# Patient Record
Sex: Male | Born: 1980 | Race: White | Hispanic: No | Marital: Married | Smoking: Never smoker
Health system: Southern US, Community
[De-identification: ages and names within clinical notes are randomized; demographics above are authoritative.]

## PROBLEM LIST (undated history)

## (undated) DIAGNOSIS — A6000 Herpesviral infection of urogenital system, unspecified: Secondary | ICD-10-CM

## (undated) DIAGNOSIS — M545 Low back pain, unspecified: Secondary | ICD-10-CM

## (undated) DIAGNOSIS — R519 Headache, unspecified: Secondary | ICD-10-CM

## (undated) DIAGNOSIS — R51 Headache: Secondary | ICD-10-CM

## (undated) HISTORY — DX: Low back pain, unspecified: M54.50

## (undated) HISTORY — DX: Low back pain: M54.5

## (undated) HISTORY — DX: Herpesviral infection of urogenital system, unspecified: A60.00

## (undated) HISTORY — DX: Headache, unspecified: R51.9

## (undated) HISTORY — DX: Headache: R51

---

## 2000-01-07 HISTORY — PX: TENDON REPAIR: SHX5111

## 2011-09-20 ENCOUNTER — Other Ambulatory Visit: Payer: Self-pay | Admitting: Orthopedic Surgery

## 2011-09-20 DIAGNOSIS — R531 Weakness: Secondary | ICD-10-CM

## 2011-09-20 DIAGNOSIS — M545 Low back pain: Secondary | ICD-10-CM

## 2011-09-24 ENCOUNTER — Other Ambulatory Visit: Payer: Self-pay | Admitting: Orthopedic Surgery

## 2011-09-24 DIAGNOSIS — M549 Dorsalgia, unspecified: Secondary | ICD-10-CM

## 2011-09-27 ENCOUNTER — Other Ambulatory Visit: Payer: Self-pay

## 2011-09-27 ENCOUNTER — Inpatient Hospital Stay
Admission: RE | Admit: 2011-09-27 | Discharge: 2011-09-27 | Payer: Self-pay | Source: Ambulatory Visit | Attending: Orthopedic Surgery | Admitting: Orthopedic Surgery

## 2011-09-27 NOTE — Discharge Instructions (Signed)

## 2011-10-04 ENCOUNTER — Other Ambulatory Visit: Payer: Self-pay

## 2011-10-07 ENCOUNTER — Ambulatory Visit
Admission: RE | Admit: 2011-10-07 | Discharge: 2011-10-07 | Disposition: A | Discharge status: Home / Self Care | Payer: 59 | Source: Ambulatory Visit | Attending: Orthopedic Surgery | Admitting: Orthopedic Surgery

## 2011-10-07 VITALS — BP 115/61 | HR 61

## 2011-10-07 DIAGNOSIS — M549 Dorsalgia, unspecified: Secondary | ICD-10-CM

## 2011-10-07 MED ORDER — IOHEXOL 180 MG/ML  SOLN
15.0000 mL | Freq: Once | INTRAMUSCULAR | Status: AC | PRN
  Administered 2011-10-07: 15 mL via EPIDURAL

## 2011-10-07 MED ORDER — DIAZEPAM 5 MG PO TABS
10.0000 mg | ORAL_TABLET | Freq: Once | ORAL | Status: AC
  Administered 2011-10-07: 5 mg via ORAL

## 2011-10-07 MED ORDER — ONDANSETRON HCL 4 MG/2ML IJ SOLN: 4.0000 mg | Freq: Four times a day (QID) | INTRAMUSCULAR | Status: DC | PRN

## 2011-10-07 NOTE — Discharge Instructions (Signed)

## 2011-10-11 ENCOUNTER — Telehealth: Payer: Self-pay

## 2011-10-11 NOTE — Telephone Encounter (Signed)
Patient's wife Ricardo Riggs called from the [place] where they are spending July 4th holiday to state Ricardo Riggs has been experiencing pressure behind his eyes and nose and in neck and pelvis since myelogram here Monday, 10/07/11.  He has "good moments," usually in the mornings, when he has no symptoms, but they progress as the day goes on.  Explained these symptoms sound like spinal fluid leak symptoms, though not so much the common headache symptoms.  Encouraged her to have Ricardo Riggs lie down as much as he can to repeat the 24 hours of strict bedrest and to force fluids.  Explained epidural blood patch procedure and told them I'd try to get order from Dr. August Saucer for one on Monday, 10/14/11, though I hoped he didn't need it.  jkl

## 2011-10-14 ENCOUNTER — Other Ambulatory Visit: Payer: Self-pay | Admitting: Orthopedic Surgery

## 2011-10-14 ENCOUNTER — Ambulatory Visit
Admission: RE | Admit: 2011-10-14 | Discharge: 2011-10-14 | Disposition: A | Discharge status: Home / Self Care | Payer: 59 | Source: Ambulatory Visit | Attending: Orthopedic Surgery | Admitting: Orthopedic Surgery

## 2011-10-14 VITALS — BP 118/69 | HR 68 | Ht 70.0 in | Wt 239.0 lb

## 2011-10-14 DIAGNOSIS — G971 Other reaction to spinal and lumbar puncture: Secondary | ICD-10-CM

## 2011-10-14 MED ORDER — IOHEXOL 180 MG/ML  SOLN
1.0000 mL | Freq: Once | INTRAMUSCULAR | Status: AC | PRN
  Administered 2011-10-14: 1 mL via EPIDURAL

## 2011-10-14 NOTE — Progress Notes (Addendum)
20cc of blood drawn from left antecubital vein, site unremarkable. Pt tolerated procedure well. Blood drawn for blood patch due to positional headache.  1440 pt returned from procedure room and is doing well. Wife to bedside.

## 2011-12-11 ENCOUNTER — Telehealth: Payer: Self-pay

## 2011-12-11 NOTE — Telephone Encounter (Signed)
After speaking with Dr. Deanne Coffer, I called Ricardo Riggs back and told her she needs to follow up with Dr. August Saucer or primary care doctor since so much time has transpired (two months) since we have seen patient and since other physicians have been doing injections on his back.  Dr. Deanne Coffer feels there are too many variables involved and that a physician responsible for driving Ricardo Riggs's care needs to handle his ongoing symptoms.  I suggested Ricardo Riggs first contact Dr. August Saucer and then perhaps their primary care physician, if needed.  jkl

## 2011-12-11 NOTE — Telephone Encounter (Signed)
Patient's wife called this morning to report ongoing spinal headache symptoms since blood patch October 14, 2011 after myelogram here October 07, 2011.  She states the blood patch was "really painful for Jakorey," so bad that she had to provide him something in which to urinate because he couldn't get out of bed.  "Not sure if the blood patch helped."  Patient is becoming "forgetful" and "irritable."   If he sneezes or bends over, symptoms get worse and patient experiences "blackout moments with dots" briefly.  Wife states patient has had "cortisone shots (lumbar epidural steroid injections?) across the street," ordered by Dr. August Saucer, and they "haven't helped his back pain."  I told her I would talk with my radiologist and get back with her shortly.  jkl

## 2011-12-18 ENCOUNTER — Ambulatory Visit
Admission: RE | Admit: 2011-12-18 | Discharge: 2011-12-18 | Disposition: A | Discharge status: Home / Self Care | Payer: Managed Care, Other (non HMO) | Source: Ambulatory Visit | Attending: Orthopedic Surgery | Admitting: Orthopedic Surgery

## 2011-12-18 ENCOUNTER — Other Ambulatory Visit: Payer: Self-pay | Admitting: Orthopedic Surgery

## 2011-12-18 DIAGNOSIS — G971 Other reaction to spinal and lumbar puncture: Secondary | ICD-10-CM

## 2011-12-18 MED ORDER — IOHEXOL 180 MG/ML  SOLN
1.0000 mL | Freq: Once | INTRAMUSCULAR | Status: AC | PRN
  Administered 2011-12-18: 1 mL via EPIDURAL

## 2011-12-18 NOTE — Progress Notes (Signed)
20cc blood drawn from left AC space for procedure without difficulty; site unremarkable.  jkl 

## 2011-12-23 ENCOUNTER — Other Ambulatory Visit: Payer: Self-pay | Admitting: Radiology

## 2011-12-23 ENCOUNTER — Telehealth: Payer: Self-pay

## 2011-12-23 ENCOUNTER — Ambulatory Visit
Admission: RE | Admit: 2011-12-23 | Discharge: 2011-12-23 | Disposition: A | Discharge status: Home / Self Care | Payer: Managed Care, Other (non HMO) | Source: Ambulatory Visit | Attending: Radiology | Admitting: Radiology

## 2011-12-23 ENCOUNTER — Other Ambulatory Visit: Payer: Self-pay | Admitting: Orthopedic Surgery

## 2011-12-23 DIAGNOSIS — R51 Headache: Secondary | ICD-10-CM

## 2011-12-23 NOTE — Progress Notes (Signed)
Patient ID: Ricardo Riggs, male   DOB: 25-Apr-1980, 31 y.o.   MRN: [id number] Established Patient Office Visit - Level II - [phone]  Referring physician: Dr. August Saucer  Subjective: The patient continues have headaches and blurred vision without significant improvement following a blood plaque she 12/18/2011. The symptoms initially began after a myelogram 10/07/2011. There is some relief following a blood patch 10/14/2011. This continued have symptoms since.  The patient describes pressure grounds of building of during the day at the bases head with progressive blurred vision and disorientation. He denies any loss of consciousness. He states the symptoms are exacerbated by changing from sitting to standing position frequently, prolonged sitting, or bending over to his feet. The symptoms are improved when lying flat for the for 5 hours and lobe best with the first to the wakes in the morning. He has been put on some muscle relaxant is within the lumen is low back pain but no significant change in his head symptoms.  At times this symptoms seem to be related to episodes of his back pain. At other times the symptoms are not related to exacerbation of his back pain.  The symptoms are worse with sitting and standing and are partially relieved when lying down. He did seem to have an initial response to the first blood patch in July, but symptoms began returned the following day.  Objective: Blood pressure 116/82. Temperature 98.2. Pulse 66. Respiratory rate 18.  Physical exam is otherwise deferred.  Assessment/Plan: Although the patient's symptoms are temporally related to his myelogram, they are somewhat atypical of the CSF leak headache and had responded only transiently to a blood patch at the level of the lumbar puncture. It seems that any CSF leak occurring at the regional lumbar puncture site has been appropriately treated with blood patches on the right at L3-4.  The differential  diagnosis includes an occult CSF leak elsewhere or headaches related to progression of his back pain. Although the back pain certainly this does seem to exacerbate his headaches, they are not always temporally related.  Would certainly perform another myelogram to look for any occult CSF leak. Prior to proceeding with this, I would suggest that he be seen by a neurologist specializing in headaches. I have therefore referred the patient to Dr. Karenann Cai who has agreed to see Mr. Photographer tomorrow.  If he feels that a CSF headache is still most likely cause, despite atypical symptoms, an additional myelogram could certainly be performed.

## 2011-12-23 NOTE — Telephone Encounter (Signed)
Patient's wife called to say he was doing well after second blood patch on Wednesday, December 18, 2011.  He completed the 24 hours of bedrest and got up Thursday feeling "fabulous; his old self."  Then later that day he got into car and the symptoms "started up all over again."  She reports patient is "fine" when he is standing or lying down but becomes "spacey and has that head pressure again" whenever he sits down.  Asked Ricardo Riggs to call Dr. August Riggs to relay this information to him.  She states she will.  jkl

## 2011-12-24 ENCOUNTER — Other Ambulatory Visit: Payer: Self-pay | Admitting: Neurology

## 2011-12-27 ENCOUNTER — Ambulatory Visit
Admission: RE | Admit: 2011-12-27 | Discharge: 2011-12-27 | Disposition: A | Discharge status: Home / Self Care | Payer: Managed Care, Other (non HMO) | Source: Ambulatory Visit | Attending: Neurology | Admitting: Neurology

## 2011-12-27 VITALS — BP 141/101 | HR 75

## 2011-12-27 VITALS — BP 96/53 | HR 64

## 2011-12-27 LAB — CSF CELL COUNT WITH DIFFERENTIAL: WBC, CSF: 2 cu mm (ref 0–5)

## 2011-12-27 LAB — GLUCOSE, CSF: Glucose, CSF: 59 mg/dL (ref 43–76)

## 2011-12-27 MED ORDER — DIAZEPAM 5 MG PO TABS
10.0000 mg | ORAL_TABLET | Freq: Once | ORAL | Status: AC
  Administered 2011-12-27: 10 mg via ORAL

## 2011-12-27 MED ORDER — HYDROCODONE-ACETAMINOPHEN 5-325 MG PO TABS
1.0000 | ORAL_TABLET | Freq: Once | ORAL | Status: AC
  Administered 2011-12-27: 1 via ORAL

## 2011-12-27 MED ORDER — IOHEXOL 180 MG/ML  SOLN
10.0000 mL | Freq: Once | INTRAMUSCULAR | Status: AC | PRN
  Administered 2011-12-27: 10 mL via INTRATHECAL

## 2011-12-28 LAB — INDIA INK CSF: India Ink CSF: NONE SEEN

## 2011-12-30 ENCOUNTER — Telehealth: Payer: Self-pay | Admitting: Radiology

## 2011-12-30 NOTE — Telephone Encounter (Signed)
Pt's wife describes his symptoms as much the same the same as before the myelo last Friday. His hip and low back c/o were taken care of with application of cold and heat Saturday, and felt much better on Sunday.  Pressure in head is similar to what was described for the past several weeks. Explained that Dr. Clarisse Gouge should be the one to sort this out as Dr. Alfredo Batty had told him he could not see a spinal fluid leak on Friday.

## 2011-12-31 LAB — CSF CULTURE W GRAM STAIN
Gram Stain: NONE SEEN
Gram Stain: NONE SEEN
Organism ID, Bacteria: NO GROWTH

## 2012-01-08 ENCOUNTER — Other Ambulatory Visit: Payer: Self-pay | Admitting: Neurology

## 2012-01-08 DIAGNOSIS — Z139 Encounter for screening, unspecified: Secondary | ICD-10-CM

## 2012-01-08 DIAGNOSIS — R51 Headache: Secondary | ICD-10-CM

## 2012-01-10 ENCOUNTER — Ambulatory Visit
Admission: RE | Admit: 2012-01-10 | Discharge: 2012-01-10 | Disposition: A | Discharge status: Home / Self Care | Payer: Managed Care, Other (non HMO) | Source: Ambulatory Visit | Attending: Neurology | Admitting: Neurology

## 2012-01-10 DIAGNOSIS — Z139 Encounter for screening, unspecified: Secondary | ICD-10-CM

## 2012-01-11 ENCOUNTER — Ambulatory Visit
Admission: RE | Admit: 2012-01-11 | Discharge: 2012-01-11 | Disposition: A | Discharge status: Home / Self Care | Payer: Managed Care, Other (non HMO) | Source: Ambulatory Visit | Attending: Neurology | Admitting: Neurology

## 2012-01-11 DIAGNOSIS — R51 Headache: Secondary | ICD-10-CM

## 2012-01-11 MED ORDER — GADOBENATE DIMEGLUMINE 529 MG/ML IV SOLN
20.0000 mL | Freq: Once | INTRAVENOUS | Status: AC | PRN
  Administered 2012-01-11: 20 mL via INTRAVENOUS

## 2012-01-13 ENCOUNTER — Ambulatory Visit
Admission: RE | Admit: 2012-01-13 | Discharge: 2012-01-13 | Disposition: A | Discharge status: Home / Self Care | Payer: BC Managed Care – PPO | Source: Ambulatory Visit | Attending: Neurology | Admitting: Neurology

## 2012-01-13 ENCOUNTER — Other Ambulatory Visit: Payer: Self-pay | Admitting: Neurology

## 2012-01-13 DIAGNOSIS — G44009 Cluster headache syndrome, unspecified, not intractable: Secondary | ICD-10-CM

## 2012-01-13 DIAGNOSIS — G932 Benign intracranial hypertension: Secondary | ICD-10-CM

## 2012-07-31 ENCOUNTER — Encounter: Payer: Self-pay | Admitting: Family Medicine

## 2012-07-31 ENCOUNTER — Ambulatory Visit (INDEPENDENT_AMBULATORY_CARE_PROVIDER_SITE_OTHER): Payer: Managed Care, Other (non HMO) | Admitting: Family Medicine

## 2012-07-31 ENCOUNTER — Ambulatory Visit: Payer: Self-pay | Admitting: Medical

## 2012-07-31 VITALS — BP 108/76 | HR 65 | Wt 248.0 lb

## 2012-07-31 DIAGNOSIS — R079 Chest pain, unspecified: Secondary | ICD-10-CM

## 2012-07-31 LAB — CBC WITH DIFFERENTIAL/PLATELET
Basophils Absolute: 0 10*3/uL (ref 0.0–0.1)
Basophils Relative: 1 % (ref 0–1)
Eosinophils Absolute: 0.1 10*3/uL (ref 0.0–0.7)
Hemoglobin: 14.5 g/dL (ref 13.0–17.0)
MCH: 29.7 pg (ref 26.0–34.0)
MCHC: 33.8 g/dL (ref 30.0–36.0)
Neutro Abs: 2.3 10*3/uL (ref 1.7–7.7)
Neutrophils Relative %: 48 % (ref 43–77)
Platelets: 275 10*3/uL (ref 150–400)
RDW: 13.6 % (ref 11.5–15.5)

## 2012-07-31 LAB — LIPID PANEL
Cholesterol: 181 mg/dL (ref 0–200)
HDL: 40 mg/dL (ref >39)
LDL Cholesterol: 127 mg/dL — ABNORMAL HIGH (ref 0–99)
Total CHOL/HDL Ratio: 4.5 Ratio
Triglycerides: 72 mg/dL (ref <150)
VLDL: 14 mg/dL (ref 0–40)

## 2012-07-31 LAB — COMPREHENSIVE METABOLIC PANEL
AST: 20 U/L (ref 0–37)
Albumin: 4.7 g/dL (ref 3.5–5.2)
Alkaline Phosphatase: 60 U/L (ref 39–117)
Potassium: 4.6 mEq/L (ref 3.5–5.3)
Sodium: 139 mEq/L (ref 135–145)
Total Bilirubin: 0.9 mg/dL (ref 0.3–1.2)
Total Protein: 7.1 g/dL (ref 6.0–8.3)

## 2012-07-31 MED ORDER — NITROGLYCERIN 0.4 MG SL SUBL: SUBLINGUAL_TABLET | SUBLINGUAL | Status: DC

## 2012-07-31 NOTE — Progress Notes (Signed)
Pt is scheduled with Dr. Viann Fish Cardiology on Monday April 28 @ 10:30am

## 2012-07-31 NOTE — Progress Notes (Signed)
  Subjective:    Patient ID: Ricardo Riggs, male    DOB: 1980-12-25, 32 y.o.   MRN: [id number]  HPI He is here for evaluation of chest pain. He has had 3 episodes of pain in the last 6 weeks, the most recent one was yesterday. He describes the pain as sharp and in the mid chest area associated with weakness and a heavy feeling in his jaws with shortness of breath and clammy feeling. The pain can last up to a couple of hours. The episodes occurred when he was sedentary and one when he was doing his normal activities. He does not smoke. No family history of heart disease.   Review of Systems     Objective:   Physical Exam alert and in no distress. Tympanic membranes and canals are normal. Throat is clear. Tonsils are normal. Neck is supple without adenopathy or thyromegaly. Cardiac exam shows a regular sinus rhythm without murmurs or gallops. Lungs are clear to auscultation. EKG shows no acute changes       Assessment & Plan:  Chest pain - Plan: EKG 12-Lead, CBC with Differential, Comprehensive metabolic panel, Lipid panel, Ambulatory referral to Cardiology, nitroGLYCERIN (NITROSTAT) 0.4 MG SL tablet I discussed the chest pain with him and his wife. Although he is young and does not have a family history, the symptoms are certainly worrisome. Discussed use of nitroglycerin in regard to the pain and when to go to the emergency room prior to being seen by cardiology.

## 2012-08-03 ENCOUNTER — Encounter: Payer: Self-pay | Admitting: Cardiology

## 2012-08-03 DIAGNOSIS — R0789 Other chest pain: Secondary | ICD-10-CM | POA: Insufficient documentation

## 2012-08-03 DIAGNOSIS — E669 Obesity, unspecified: Secondary | ICD-10-CM | POA: Insufficient documentation

## 2012-08-03 DIAGNOSIS — M519 Unspecified thoracic, thoracolumbar and lumbosacral intervertebral disc disorder: Secondary | ICD-10-CM | POA: Insufficient documentation

## 2012-08-03 NOTE — Progress Notes (Signed)
Patient ID: Ricardo Riggs, male   DOB: 06-Jan-1981, 32 y.o.   MRN: [id number] Ricardo Riggs, [place]    Date of visit:  08/03/2012 DOB:  [birth year]    Age:  32 yrs. Medical record number:  [id number]     Account number:  [id number] Primary Care Provider: Sharlot Gowda C ____________________________ CURRENT DIAGNOSES  1. Obesity(BMI30-40)  2. Chest Pain ____________________________ ALLERGIES  NKDA ____________________________ MEDICATIONS  1. methocarbamol 500 mg tablet, PRN  2. meloxicam 15 mg tablet, 1 p.o. daily ____________________________ CHIEF COMPLAINTS  Began 6 weks ago  Chest pain  l arm + jaw pain ____________________________ HISTORY OF PRESENT ILLNESS  Patient seen at the request of Dr. Susann Givens for evaluation of chest pain. The patient and his family recently moved from [place] to [place] with a job change. The patient has been in good health except for obesity for many years. He is normally able to do yard work and exercise without chest discomfort. He comes in today with 3 episodes of chest pain occurring over the past 2 months. 2 of them have occurred while he was at work getting ready to work on some machinery and were described as a midsternal tightness that radiated into the jaw and somewhat into the inner aspect of his left arm. The discomfort would last for 20 minutes and then he would have jaw discomfort the rest of the day but no chest discomfort. He had another episode that occurred after eating and was similar to this but neither was associated with extreme exertion. He otherwise has no cardiac risk factors. He denies angina and has no PND, orthopnea or claudication. He has no significant edema. He does have chronic low back pain and had a treadmill several years ago because of some atypical chest pain. ____________________________ PAST HISTORY  Past Medical Illnesses:  denies hypertension or diabetes;  Cardiovascular Illnesses:  no previous history of cardiac disease.;  Surgical  Procedures:  right wrist surgery for trauma;  Cardiology Procedures-Invasive:  no history of prior cardiac procedures;  Cardiology Procedures-Noninvasive:  treadmill;  LVEF not documented,   ____________________________ CARDIO-PULMONARY TEST DATES EKG Date:  08/03/2012;   ____________________________ FAMILY HISTORY Father - age 71,  alive and well; Mother - age 66,  alive and well;  ____________________________ SOCIAL HISTORY Alcohol Use:  occasionally;  Smoking:  never smoked;  Diet:  regular diet;  Lifestyle:  married;  Exercise:  no regular exercise;  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 Eyes: denies diplopia, history of glaucoma or visual problems. Ears, Nose, Throat, Mouth:  denies any hearing loss, epistaxis, hoarseness or difficulty speaking. Respiratory: occasional wheezing Cardiovascular:  please review HPI Abdominal: denies dyspepsia, GI bleeding, constipation, or diarrhea Genitourinary-Male: no dysuria, urgency, frequency, or nocturia  Musculoskeletal:  chronic low back pain Neurological:  denies headaches, stroke, or TIA  ____________________________ PHYSICAL EXAMINATION VITAL SIGNS  Blood Pressure:  88/60 Sitting, Left arm, regular cuff  , 94/60 Standing, Left arm and regular cuff   Pulse:  78/min. Weight:  248.00 lbs. Height:  70"BMI: 35  Constitutional:  pleasant white male in no acute distress, moderately obese Skin:  warm and dry to touch, no apparent skin lesions, or masses noted. Head:  normocephalic, normal hair pattern, no masses or tenderness Eyes:  EOMS Intact, PERRLA, C and S clear, Funduscopic exam not done. ENT:  ears, nose and throat reveal no  gross abnormalities.  Dentition good. Neck:  supple, without massess. No JVD, thyromegaly or carotid bruits. Carotid upstroke normal. Chest:  normal symmetry,  clear to auscultation and percussion. Cardiac:  regular rhythm, normal S1 and S2, No S3 or S4, no murmurs, gallops or rubs detected. Abdomen:  abdomen soft,non-tender, no masses, no hepatospenomegaly, or aneurysm noted Peripheral Pulses:  the femoral,dorsalis pedis, and posterior tibial pulses are full and equal bilaterally with no bruits auscultated. Extremities & Back:  no deformities, clubbing, cyanosis, erythema or edema observed. Normal muscle strength and tone. Neurological:  no gross motor or sensory deficits noted, affect appropriate, oriented x3. ____________________________ IMPRESSIONS/PLAN  1. Chest pain with atypical features with a normal EKG and minimal risk factors 2. Obesity 3. Chronic low back pain  Recommendations:  The chest pain sounded atypical for angina to me. My recommendations would be for him to have an exercise treadmill test. Also talked about reducing weight and reducing his caloric intake.  ____________________________ TODAYS ORDERS  1. 12 Lead EKG: Today  2. treadmill:  Regular TM At [facility]                       ____________________________ Cardiology Physician:  Darden Palmer MD [facility]

## 2012-08-03 NOTE — Progress Notes (Signed)
Quick Note:  FAXED LABS TO DR.TILLEY ______

## 2012-08-14 ENCOUNTER — Encounter: Payer: Self-pay | Admitting: Internal Medicine

## 2012-09-02 ENCOUNTER — Ambulatory Visit (INDEPENDENT_AMBULATORY_CARE_PROVIDER_SITE_OTHER): Payer: Managed Care, Other (non HMO) | Admitting: Family Medicine

## 2012-09-02 ENCOUNTER — Encounter: Payer: Self-pay | Admitting: Family Medicine

## 2012-09-02 VITALS — BP 110/70 | HR 68 | Ht 71.0 in | Wt 248.0 lb

## 2012-09-02 DIAGNOSIS — A6000 Herpesviral infection of urogenital system, unspecified: Secondary | ICD-10-CM

## 2012-09-02 DIAGNOSIS — Z0000 Encounter for general adult medical examination without abnormal findings: Secondary | ICD-10-CM

## 2012-09-02 LAB — POCT URINALYSIS DIPSTICK
Bilirubin, UA: NEGATIVE
Blood, UA: NEGATIVE
Glucose, UA: NEGATIVE
Leukocytes, UA: NEGATIVE
Nitrite, UA: NEGATIVE
Urobilinogen, UA: NEGATIVE
pH, UA: 5

## 2012-09-02 MED ORDER — VALACYCLOVIR HCL 500 MG PO TABS: 500.0000 mg | ORAL_TABLET | Freq: Two times a day (BID) | ORAL | Status: DC

## 2012-09-02 NOTE — Patient Instructions (Addendum)
Please check into the date and type of your last tetanus shot.  It is recommended to have one booster of TdaP (contains pertussis--whooping cough vaccine in addition to tetanus).  If you have not had the pertussis (just TD), then booster is recommended.  If you had TdaP, you will need another Td (no pertussis) 10 years from the last injection.  Please try and get [place] the date and type of vaccine you think you had about 4 years ago  HEALTH MAINTENANCE RECOMMENDATIONS:  Please schedule appt with dentist   It is recommended that you get at least 30 minutes of aerobic exercise at least 5 days/week (for weight loss, you may need as much as 60-90 minutes). This can be any activity that gets your heart rate up. This can be divided in 10-15 minute intervals if needed, but try and build up your endurance at least once a week.  Weight bearing exercise is also recommended twice weekly.  Eat a healthy diet with lots of vegetables, fruits and fiber.  "Colorful" foods have a lot of vitamins (ie green vegetables, tomatoes, red peppers, etc).  Limit sweet tea, regular sodas and alcoholic beverages, all of which has a lot of calories and sugar.  Up to 2 alcoholic drinks daily may be beneficial for men (unless trying to lose weight, watch sugars).  Drink a lot of water.  Sunscreen of at least SPF 30 should be used on all sun-exposed parts of the skin when outside between the hours of 10 am and 4 pm (not just when at beach or pool, but even with exercise, golf, tennis, and yard work!)  Use a sunscreen that says "broad spectrum" so it covers both UVA and UVB rays, and make sure to reapply every 1-2 hours.  Remember to change the batteries in your smoke detectors when changing your clock times in the spring and fall.  Use your seat belt every time you are in a car, and please drive safely and not be distracted with cell phones and texting while driving.  Check your testicles once a month for any lumps/masses  Lab  Results  Component Value Date   CHOL 181 07/31/2012   HDL 40 07/31/2012   LDLCALC 127* 07/31/2012   TRIG 72 07/31/2012   CHOLHDL 4.5 07/31/2012    Your chol/HDL ratio should be <4.  Ideally, your HDL should be higher.  Daily exercise, fish oil and low cholesterol diet is recommended. Recheck next year  Fat and Cholesterol Control Diet Cholesterol levels in your body are determined significantly by your diet. Cholesterol levels may also be related to heart disease. The following material helps to explain this relationship and discusses what you can do to help keep your heart healthy. Not all cholesterol is bad. Low-density lipoprotein (LDL) cholesterol is the "bad" cholesterol. It may cause fatty deposits to build up inside your arteries. High-density lipoprotein (HDL) cholesterol is "good." It helps to remove the "bad" LDL cholesterol from your blood. Cholesterol is a very important risk factor for heart disease. Other risk factors are high blood pressure, smoking, stress, heredity, and weight. The heart muscle gets its supply of blood through the coronary arteries. If your LDL cholesterol is high and your HDL cholesterol is low, you are at risk for having fatty deposits build up in your coronary arteries. This leaves less room through which blood can flow. Without sufficient blood and oxygen, the heart muscle cannot function properly and you may feel chest pains (angina pectoris). When a  coronary artery closes up entirely, a part of the heart muscle may die causing a heart attack (myocardial infarction). CHECKING CHOLESTEROL When your caregiver sends your blood to a lab to be examined for cholesterol, a complete lipid (fat) profile may be done. With this test, the total amount of cholesterol and levels of LDL and HDL are determined. Triglycerides are a type of fat that circulates in the blood. They can also be used to determine heart disease risk. The list below describes what the numbers should  be: Test: Total Cholesterol.  Less than 200 mg/dl. Test: LDL "bad cholesterol."  Less than 100 mg/dl.  Less than 70 mg/dl if you are at very high risk of a heart attack or sudden cardiac death. Test: HDL "good cholesterol."  Greater than 50 mg/dl for women.  Greater than 40 mg/dl for men. Test: Triglycerides.  Less than 150 mg/dl. CONTROLLING CHOLESTEROL WITH DIET Although exercise and lifestyle factors are important, your diet is key. That is because certain foods are known to raise cholesterol and others to lower it. The goal is to balance foods for their effect on cholesterol and more importantly, to replace saturated and trans fat with other types of fat, such as monounsaturated fat, polyunsaturated fat, and omega-3 fatty acids. On average, a person should consume no more than 15 to 17 g of saturated fat daily. Saturated and trans fats are considered "bad" fats, and they will raise LDL cholesterol. Saturated fats are primarily found in animal products such as meats, butter, and cream. However, that does not mean you need to give up all your favorite foods. Today, there are good tasting, low-fat, low-cholesterol substitutes for most of the things you like to eat. Choose low-fat or nonfat alternatives. Choose round or loin cuts of red meat. These types of cuts are lowest in fat and cholesterol. Chicken (without the skin), fish, veal, and ground [place] breast are great choices. Eliminate fatty meats, such as hot dogs and salami. Even shellfish have little or no saturated fat. Have a 3 oz (85 g) portion when you eat lean meat, poultry, or fish. Trans fats are also called "partially hydrogenated oils." They are oils that have been scientifically manipulated so that they are solid at room temperature resulting in a longer shelf life and improved taste and texture of foods in which they are added. Trans fats are found in stick margarine, some tub margarines, cookies, crackers, and baked goods.   When baking and cooking, oils are a great substitute for butter. The monounsaturated oils are especially beneficial since it is believed they lower LDL and raise HDL. The oils you should avoid entirely are saturated tropical oils, such as coconut and palm.  Remember to eat a lot from food groups that are naturally free of saturated and trans fat, including fish, fruit, vegetables, beans, grains (barley, rice, couscous, bulgur wheat), and pasta (without cream sauces).  IDENTIFYING FOODS THAT LOWER CHOLESTEROL  Soluble fiber may lower your cholesterol. This type of fiber is found in fruits such as apples, vegetables such as broccoli, potatoes, and carrots, legumes such as beans, peas, and lentils, and grains such as barley. Foods fortified with plant sterols (phytosterol) may also lower cholesterol. You should eat at least 2 g per day of these foods for a cholesterol lowering effect.  Read package labels to identify low-saturated fats, trans fat free, and low-fat foods at the supermarket. Select cheeses that have only 2 to 3 g saturated fat per ounce. Use a heart-healthy tub  margarine that is free of trans fats or partially hydrogenated oil. When buying baked goods (cookies, crackers), avoid partially hydrogenated oils. Breads and muffins should be made from whole grains (whole-wheat or whole oat flour, instead of "flour" or "enriched flour"). Buy non-creamy canned soups with reduced salt and no added fats.  FOOD PREPARATION TECHNIQUES  Never deep-fry. If you must fry, either stir-fry, which uses very little fat, or use non-stick cooking sprays. When possible, broil, bake, or roast meats, and steam vegetables. Instead of putting butter or margarine on vegetables, use lemon and herbs, applesauce, and cinnamon (for squash and sweet potatoes), nonfat yogurt, salsa, and low-fat dressings for salads.  LOW-SATURATED FAT / LOW-FAT FOOD SUBSTITUTES Meats / Saturated Fat (g)  Avoid: Steak, marbled (3 oz/85 g) / 11  g  Choose: Steak, lean (3 oz/85 g) / 4 g  Avoid: Hamburger (3 oz/85 g) / 7 g  Choose: Hamburger, lean (3 oz/85 g) / 5 g  Avoid: Ham (3 oz/85 g) / 6 g  Choose: Ham, lean cut (3 oz/85 g) / 2.4 g  Avoid: Chicken, with skin, dark meat (3 oz/85 g) / 4 g  Choose: Chicken, skin removed, dark meat (3 oz/85 g) / 2 g  Avoid: Chicken, with skin, light meat (3 oz/85 g) / 2.5 g  Choose: Chicken, skin removed, light meat (3 oz/85 g) / 1 g Dairy / Saturated Fat (g)  Avoid: Whole milk (1 cup) / 5 g  Choose: Low-fat milk, 2% (1 cup) / 3 g  Choose: Low-fat milk, 1% (1 cup) / 1.5 g  Choose: Skim milk (1 cup) / 0.3 g  Avoid: Hard cheese (1 oz/28 g) / 6 g  Choose: Skim milk cheese (1 oz/28 g) / 2 to 3 g  Avoid: Cottage cheese, 4% fat (1 cup) / 6.5 g  Choose: Low-fat cottage cheese, 1% fat (1 cup) / 1.5 g  Avoid: Ice cream (1 cup) / 9 g  Choose: Sherbet (1 cup) / 2.5 g  Choose: Nonfat frozen yogurt (1 cup) / 0.3 g  Choose: Frozen fruit bar / trace  Avoid: Whipped cream (1 tbs) / 3.5 g  Choose: Nondairy whipped topping (1 tbs) / 1 g Condiments / Saturated Fat (g)  Avoid: Mayonnaise (1 tbs) / 2 g  Choose: Low-fat mayonnaise (1 tbs) / 1 g  Avoid: Butter (1 tbs) / 7 g  Choose: Extra light margarine (1 tbs) / 1 g  Avoid: Coconut oil (1 tbs) / 11.8 g  Choose: Olive oil (1 tbs) / 1.8 g  Choose: Corn oil (1 tbs) / 1.7 g  Choose: Safflower oil (1 tbs) / 1.2 g  Choose: Sunflower oil (1 tbs) / 1.4 g  Choose: Soybean oil (1 tbs) / 2.4 g  Choose: Canola oil (1 tbs) / 1 g Document Released: 03/25/2005 Document Revised: 06/17/2011 Document Reviewed: 09/13/2010 [employer] Patient Information 2014 [place], [place].
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
## 2013-01-01 ENCOUNTER — Encounter: Payer: Self-pay | Admitting: Family Medicine

## 2013-01-01 ENCOUNTER — Ambulatory Visit: Payer: Managed Care, Other (non HMO) | Admitting: Family Medicine

## 2013-01-01 ENCOUNTER — Ambulatory Visit (INDEPENDENT_AMBULATORY_CARE_PROVIDER_SITE_OTHER): Payer: Managed Care, Other (non HMO) | Admitting: Family Medicine

## 2013-01-01 VITALS — BP 108/70 | HR 60 | Ht 70.0 in | Wt 240.0 lb

## 2013-01-01 DIAGNOSIS — N50812 Left testicular pain: Secondary | ICD-10-CM

## 2013-01-01 DIAGNOSIS — N509 Disorder of male genital organs, unspecified: Secondary | ICD-10-CM

## 2013-01-01 DIAGNOSIS — R1032 Left lower quadrant pain: Secondary | ICD-10-CM

## 2013-01-01 LAB — POCT URINALYSIS DIPSTICK
Glucose, UA: NEGATIVE
Nitrite, UA: NEGATIVE
Protein, UA: NEGATIVE
Spec Grav, UA: 1.02
Urobilinogen, UA: NEGATIVE
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
## 2013-01-01 NOTE — Patient Instructions (Addendum)
I suspect that your left sided abdominal pain, which also went into the scrotum, is likely due to a kidney stone.  There was a small amount of blood in the urine, and you did get dehydrated just prior to onset of symptoms.  Drink plenty of fluid (non-alcoholic, non-caffeinated fluids).  Expect that pain should continue to improve.  If you have severe/worsening pain, especially with any fever, you need to be re-evaluated.  Fever could suggest infection; severe pain could be that the stone isn't passing, and causing obstruction (and imaging might be needed to confirm).  Avoid getting dehydrated in future  Ureteral Colic (Kidney Stones) Ureteral colic is the result of a condition when kidney stones form inside the kidney. Once kidney stones are formed they may move into the tube that connects the kidney with the bladder (ureter). If this occurs, this condition may cause pain (colic) in the ureter.  CAUSES  Pain is caused by stone movement in the ureter and the obstruction caused by the stone. SYMPTOMS  The pain comes and goes as the ureter contracts around the stone. The pain is usually intense, sharp, and stabbing in character. The location of the pain may move as the stone moves through the ureter. When the stone is near the kidney the pain is usually located in the back and radiates to the belly (abdomen). When the stone is ready to pass into the bladder the pain is often located in the lower abdomen on the side the stone is located. At this location, the symptoms may mimic those of a urinary tract infection with urinary frequency. Once the stone is located here it often passes into the bladder and the pain disappears completely. TREATMENT   Your caregiver will provide you with medicine for pain relief.  You may require specialized follow-up X-rays.  The absence of pain does not always mean that the stone has passed. It may have just stopped moving. If the urine remains completely obstructed, it can  cause loss of kidney function or even complete destruction of the involved kidney. It is your responsibility and in your interest that X-rays and follow-ups as suggested by your caregiver are completed. Relief of pain without passage of the stone can be associated with severe damage to the kidney, including loss of kidney function on that side.  If your stone does not pass on its own, additional measures may be taken by your caregiver to ensure its removal. HOME CARE INSTRUCTIONS   Increase your fluid intake. Water is the preferred fluid since juices containing vitamin C may acidify the urine making it less likely for certain stones (uric acid stones) to pass.  Strain all urine. A strainer will be provided. Keep all particulate matter or stones for your caregiver to inspect.  Take your pain medicine as directed.  Make a follow-up appointment with your caregiver as directed.  Remember that the goal is passage of your stone. The absence of pain does not mean the stone is gone. Follow your caregiver's instructions.  Only take over-the-counter or prescription medicines for pain, discomfort, or fever as directed by your caregiver. SEEK MEDICAL CARE IF:   Pain cannot be controlled with the prescribed medicine.  You have a fever.  Pain continues for longer than your caregiver advises it should.  There is a change in the pain, and you develop chest discomfort or constant abdominal pain.  You feel faint or pass out. MAKE SURE YOU:   Understand these instructions.  Will watch your  condition.  Will get help right away if you are not doing well or get worse. Document Released: 01/02/2005 Document Revised: 06/17/2011 Document Reviewed: 09/19/2010 [facility] Patient Information 2014 [place], [place].

## 2013-02-04 ENCOUNTER — Ambulatory Visit (INDEPENDENT_AMBULATORY_CARE_PROVIDER_SITE_OTHER): Payer: Managed Care, Other (non HMO) | Admitting: Family Medicine

## 2013-02-04 ENCOUNTER — Encounter: Payer: Self-pay | Admitting: Family Medicine

## 2013-02-04 VITALS — BP 100/62 | HR 72 | Temp 98.1°F | Ht 70.0 in | Wt 238.0 lb

## 2013-02-04 DIAGNOSIS — J069 Acute upper respiratory infection, unspecified: Secondary | ICD-10-CM

## 2013-02-04 DIAGNOSIS — J029 Acute pharyngitis, unspecified: Secondary | ICD-10-CM
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
## 2013-02-04 NOTE — Patient Instructions (Signed)
Drink plenty of fluid Continue Mucinex-D (either twice daily, or just in the morning and Nyquil at night) Try sinus rinses or netipot. Continue salt water gargles, use tylenol or ibuprofen as needed for pain or fever.  Call in a week if symptoms are persistent, or worsening Return if having recurrent fevers, shortness of breath, chest pain, or other worsening symptoms.

## 2013-05-07 ENCOUNTER — Encounter: Payer: Self-pay | Admitting: Family Medicine

## 2013-05-07 ENCOUNTER — Ambulatory Visit (INDEPENDENT_AMBULATORY_CARE_PROVIDER_SITE_OTHER): Payer: Managed Care, Other (non HMO) | Admitting: Family Medicine

## 2013-05-07 VITALS — BP 106/70 | HR 68 | Wt 245.0 lb

## 2013-05-07 DIAGNOSIS — J209 Acute bronchitis, unspecified: Secondary | ICD-10-CM

## 2013-05-07 MED ORDER — AMOXICILLIN-POT CLAVULANATE 875-125 MG PO TABS: 1.0000 | ORAL_TABLET | Freq: Two times a day (BID) | ORAL | Status: AC

## 2013-05-07 NOTE — Progress Notes (Signed)
   Subjective:    Patient ID: Ricardo Riggs, male    DOB: 1980/07/21, 33 y.o.   MRN: [id number]  HPI Has a ten-day history of sore throat and nasal congestion, coughing, fatigue and malaise ;no fever, chills or earache. The coughing has gotten worse and is now productive. He has no allergies and does not smoke. 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 Does have a previous history of difficulty with Amoxil not usually clearing him. Review of Systems     Objective:   Physical Exam alert and in no distress. Tympanic membranes and canals are normal. Throat is clear. Tonsils are normal. Neck is supple without adenopathy or thyromegaly. Cardiac exam shows a regular sinus rhythm without murmurs or gallops. Lungs are clear to auscultation.        Assessment & Plan:  Acute bronchitis - Plan: amoxicillin-clavulanate (AUGMENTIN) 875-125 MG per tablet

## 2013-05-07 NOTE — Patient Instructions (Addendum)
Take all the antibiotic and if not totally back to normal when you finish call me. use any cough suppressant you wantBronchitis Bronchitis is inflammation of the airways that extend from the windpipe into the lungs (bronchi). The inflammation often causes mucus to develop, which leads to a cough. If the inflammation becomes severe, it may cause shortness of breath. CAUSES  Bronchitis may be caused by:   Viral infections.   Bacteria.   Cigarette smoke.   Allergens, pollutants, and other irritants.  SIGNS AND SYMPTOMS  The most common symptom of bronchitis is a frequent cough that produces mucus. Other symptoms include:  Fever.   Body aches.   Chest congestion.   Chills.   Shortness of breath.   Sore throat.  DIAGNOSIS  Bronchitis is usually diagnosed through a medical history and physical exam. Tests, such as chest X-rays, are sometimes done to rule out other conditions.  TREATMENT  You may need to avoid contact with whatever caused the problem (smoking, for example). Medicines are sometimes needed. These may include:  Antibiotics. These may be prescribed if the condition is caused by bacteria.  Cough suppressants. These may be prescribed for relief of cough symptoms.   Inhaled medicines. These may be prescribed to help open your airways and make it easier for you to breathe.   Steroid medicines. These may be prescribed for those with recurrent (chronic) bronchitis. HOME CARE INSTRUCTIONS  Get plenty of rest.   Drink enough fluids to keep your urine clear or pale yellow (unless you have a medical condition that requires fluid restriction). Increasing fluids may help thin your secretions and will prevent dehydration.   Only take over-the-counter or prescription medicines as directed by your health care provider.  Only take antibiotics as directed. Make sure you finish them even if you start to feel better.  Avoid secondhand smoke, irritating chemicals, and  strong fumes. These will make bronchitis worse. If you are a smoker, quit smoking. Consider using nicotine gum or skin patches to help control withdrawal symptoms. Quitting smoking will help your lungs heal faster.   Put a cool-mist humidifier in your bedroom at night to moisten the air. This may help loosen mucus. Change the water in the humidifier daily. You can also run the hot water in your shower and sit in the bathroom with the door closed for 5 10 minutes.   Follow up with your health care provider as directed.   Wash your hands frequently to avoid catching bronchitis again or spreading an infection to others.  SEEK MEDICAL CARE IF: Your symptoms do not improve after 1 week of treatment.  SEEK IMMEDIATE MEDICAL CARE IF:  Your fever increases.  You have chills.   You have chest pain.   You have worsening shortness of breath.   You have bloody sputum.  You faint.  You have lightheadedness.  You have a severe headache.   You vomit repeatedly. MAKE SURE YOU:   Understand these instructions.  Will watch your condition.  Will get help right away if you are not doing well or get worse. Document Released: 03/25/2005 Document Revised: 01/13/2013 Document Reviewed: 11/17/2012 [facility] Patient Information 2014 [place], [place].

## 2014-03-30 IMAGING — CT CT L SPINE W/ CM
3 of 13 series · 10 of 33 positions shown, 11 images · IV contrast (omnipaque)
Comparison: Lumbar myelogram 10/07/2011.

CLINICAL DATA: Positional headaches following lumbar myelography.
The patient has had minimal relief with two epidural blood patches.

MYELOGRAM INJECTION
TECHNIQUE: Informed consent was obtained from the patient prior to
the procedure, including potential complications of headache,
allergy, infection and pain.  A timeout procedure was performed.
With the patient prone, the lower back was prepped with Betadine.
1% Lidocaine was used for local anesthesia.  Lumbar puncture was
performed at the left paramidline L2-3 level using a 20 gauge
needle with return of clear CSF.  10 ml of Omnipaque 100was
injected into the subarachnoid space .
TECHNIQUE: I personally performed the lumbar puncture and
administered the intrathecal contrast. I also personally supervised
acquisition of the myelogram images. Following injection of
intrathecal Omnipaque contrast, spine imaging in multiple
projections was performed using fluoroscopy.
Fluoroscopy Time: 2.49 minutes.
TECHNIQUE: CT imaging of the cervical spine was performed after
intrathecal contrast administration. Multiplanar CT image
reconstructions were also generated.
TECHNIQUE: CT imaging of the thoracic spine was performed after
intrathecal contrast administration.  Multiplanar CT image
TECHNIQUE: CT imaging of the lumbar spine was performed after

[Series 2: l spine bone · axial · 0.31mm/px · z∈[-292,-74]mm · 2 of 262 slices shown, 3 images]
[im 88/262  soft-tissue]
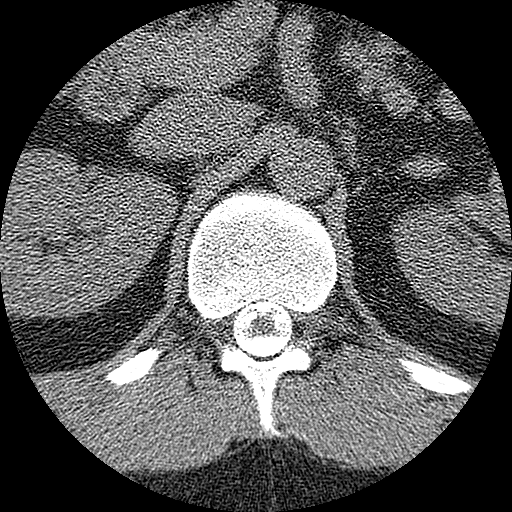
[im 88/262  bone]
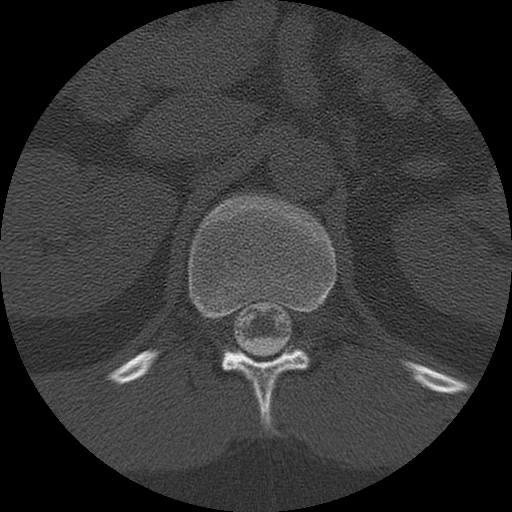
[im 175/262  bone]
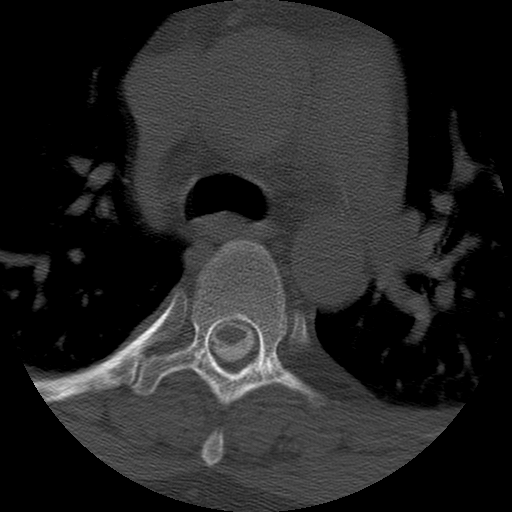

[Series 3: l. spine soft · axial · 0.31mm/px · z∈[-346,-22]mm · 3 of 261 slices shown]
[im 66/261  soft-tissue]
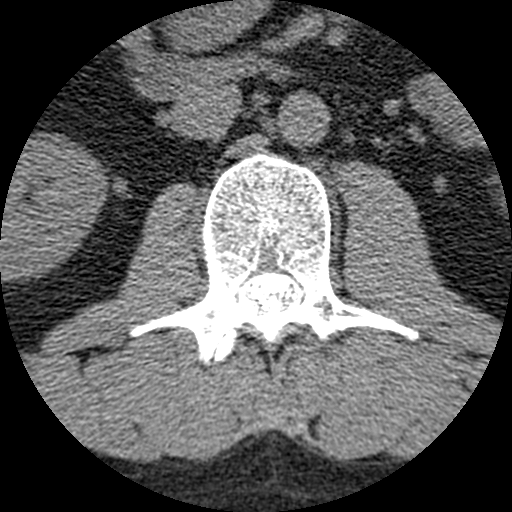
[im 131/261  soft-tissue]
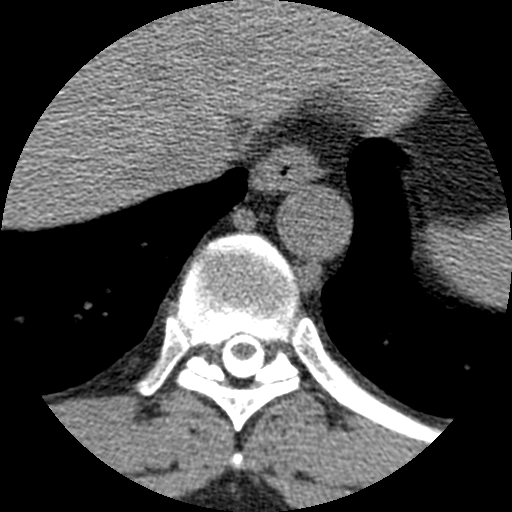
[im 196/261  soft-tissue]
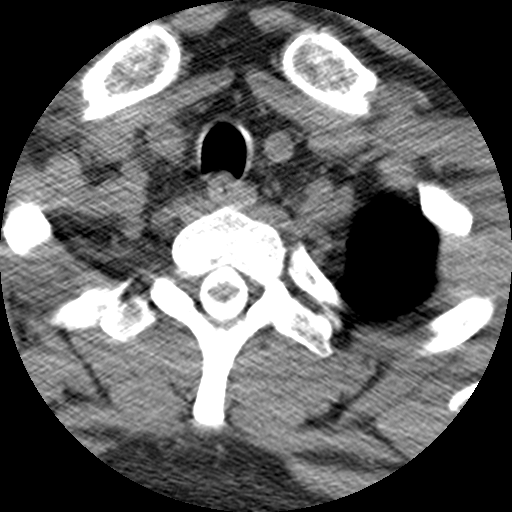

[Series 403: tsp sag · sagittal · 0.64mm/px · 5 of 50 slices shown]
[im 9/50  bone]
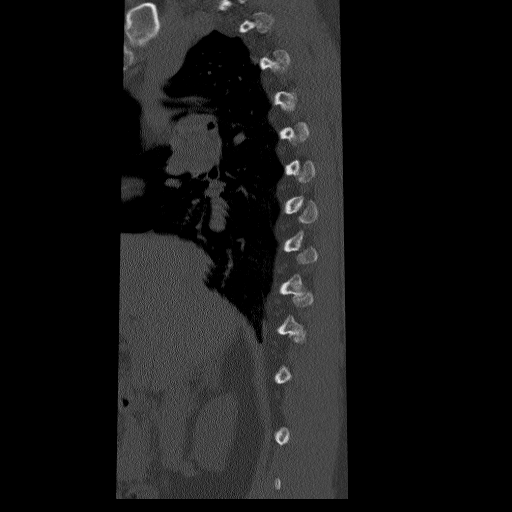
[im 17/50  bone]
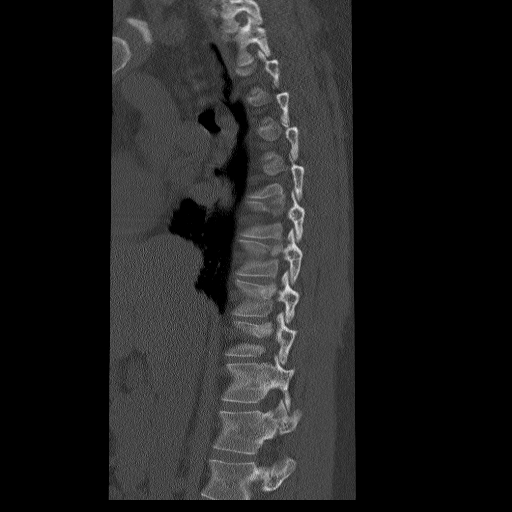
[im 25/50  bone]
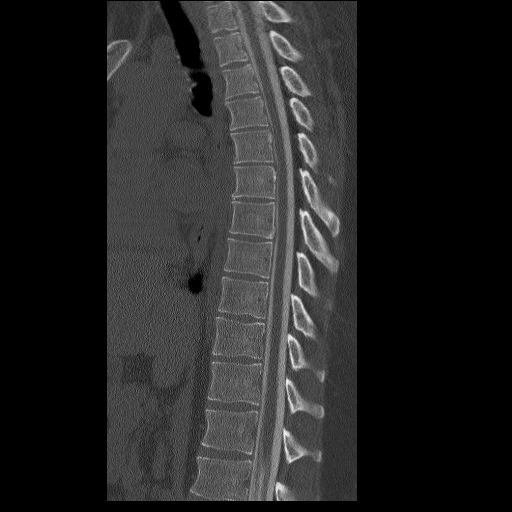
[im 33/50  bone]
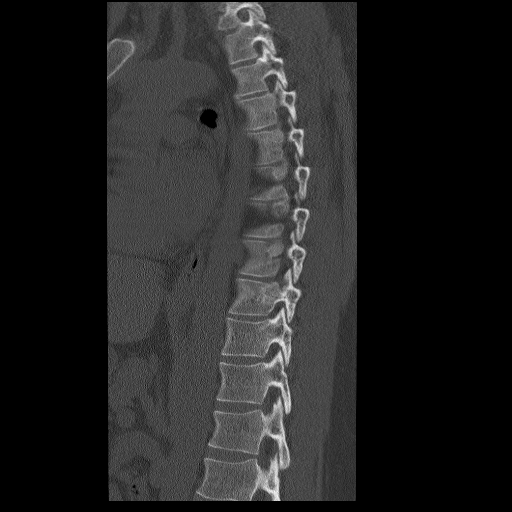
[im 41/50  bone]
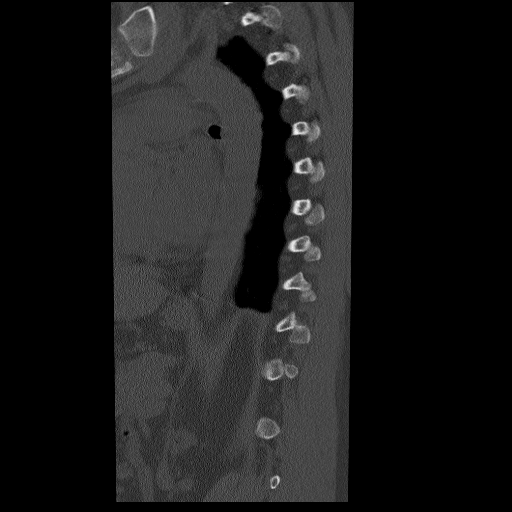

[10 of 33 positions shown; findings below may reference images not displayed]

IMPRESSION: Successful injection of  intrathecal contrast for myelography.

MYELOGRAM CERVICAL AND THORACIC AND LUMBAR
FINDINGS: Prominence of the epidural space at L5-S1 is again noted.
The lumbar nerve roots fill normally on both sides.

No significant CSF leak is evident within the lumbar spine.

Imaging of the thoracic spine demonstrates no significant disc
disease, focal stenosis, or contrast leak.

Imaging of the cervical spine demonstrates normal filling of the
nerve roots bilaterally.  A mild disc osteophyte complex is present
at C4-5 without significant stenosis elsewhere.  No significant
contrast leak is evident.
IMPRESSION: 1.  No significant contrast leak identified in the cervical,
thoracic, or lumbar spine.
2.  Stable prominence of the ventral epidural space at L5-S1.
3.  Minimal disc osteophyte complex at C4-5 without significant
stenosis.
4.  No significant thoracic disc disease or stenosis.


CT MYELOGRAPHY CERVICAL SPINE
FINDINGS: Mild rightward curvature is present in the cervical
spine.  This may be positional.  Contrast is contained within the
thecal sac.  A disc osteophyte complex partially effaces the
ventral CSF at C4-5.  In mild disc osteophyte complex is present at
C5-6 as well.  There is slight anterolisthesis at C4-5.  There is
no significant disc disease at C6-7.  The foramina are present
bilaterally.
IMPRESSION: 1.  No evidence for CSF leak.
2.  Mild disc osteophyte complex at C4-5 and C5-6 with effacement
of the ventral CSF at both levels but no significant central canal
stenosis.
3.  Mild anterolisthesis at C4-5.


CT MYELOGRAPHY THORACIC SPINE
FINDINGS: Contrast is contained within the thecal sac.  There is
no focal extravasation or CSF leak.

A minimal left paracentral disc protrusion is present at C3-4.  No
other significant disc herniations are present.  There is no
significant stenosis.  The foramina are patent bilaterally.  The
conus medullaris terminates at L1, within normal limits.  The
vertebral body heights and alignment are maintained.  The
paraspinous soft tissues and visualized lung bases are clear.
IMPRESSION: 1.  No evidence for CSF leak.
2.  No significant disc herniation or focal stenosis.



CT MYELOGRAPHY LUMBAR SPINE
FINDINGS: Focal extradural contrast is present via a left
paramidline approach at L2-3, corresponding to the needle puncture
site for today's examination.  There is no extradural contrast on
the right at L3-4.  No other significant extradural contrast is
present.

There is no significant disc disease at the L4-5 level and higher.

Chronic loss of disc height and a calcified disc are present at L5-
S1.  A vacuum disc is present.  There is greater loss of disc
height on the right.  Moderate right foraminal stenosis is present.
A central disc protrusion is evident without significant central
canal narrowing.
IMPRESSION: 1.  No focal CSF leak associated with the previous lumbar puncture
at to L3-4 on the right.
2.  There is a focal linear area of contrast the left paramidline
approach at L2-3, corresponding to 2 days puncture.
3.  Stable disc disease at L5-S1 with moderate right foraminal
stenosis.

## 2014-04-16 IMAGING — US US EXTREM  UP VENOUS*R*
1 series · 10 of 10 positions shown · non-contrast
Comparison: MR 01/11/2012.

CLINICAL DATA: Headaches, increased intracranial pressure.
Possible right jugular vein thrombosis on MR venogram.

RIGHT UPPER EXTREMITY VENOUS ULTRASOUND
TECHNIQUE: Limited duplex scan of the right upper extremity was
performed.

[Series 1: us extrem up venous*right* · 10 of 10 slices shown]
[im 1/10]
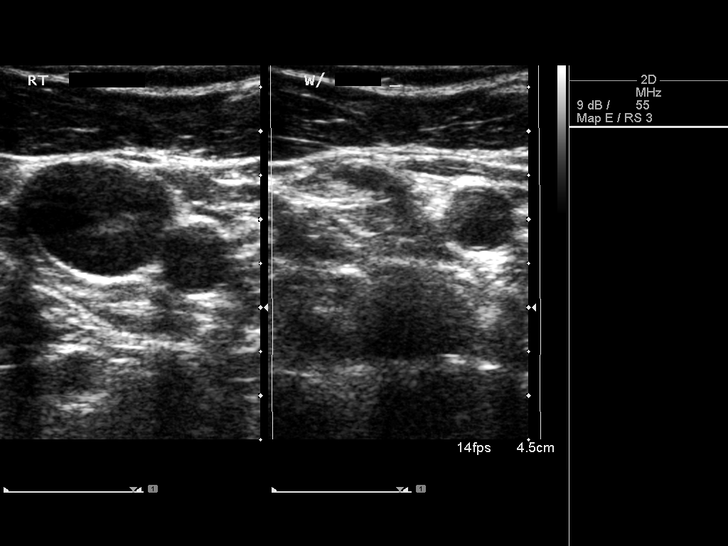
[im 2/10]
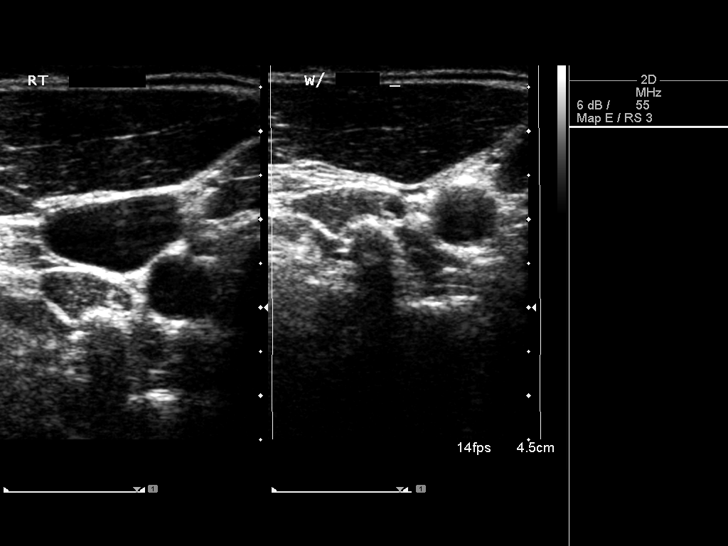
[im 3/10]
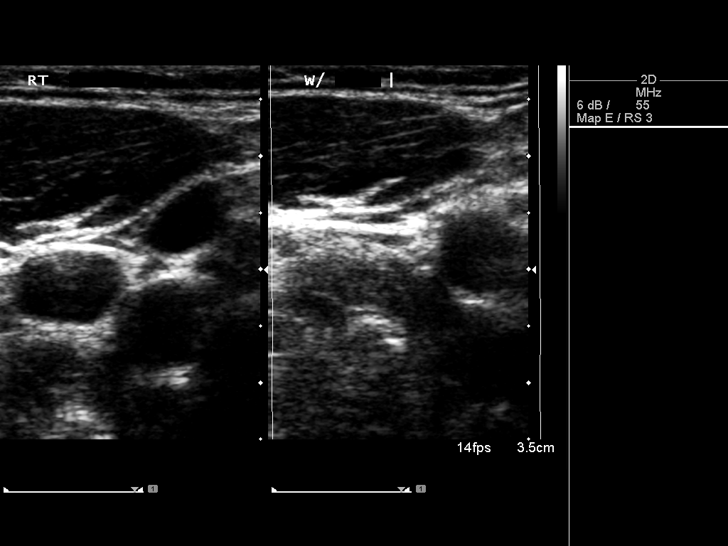
[im 4/10]
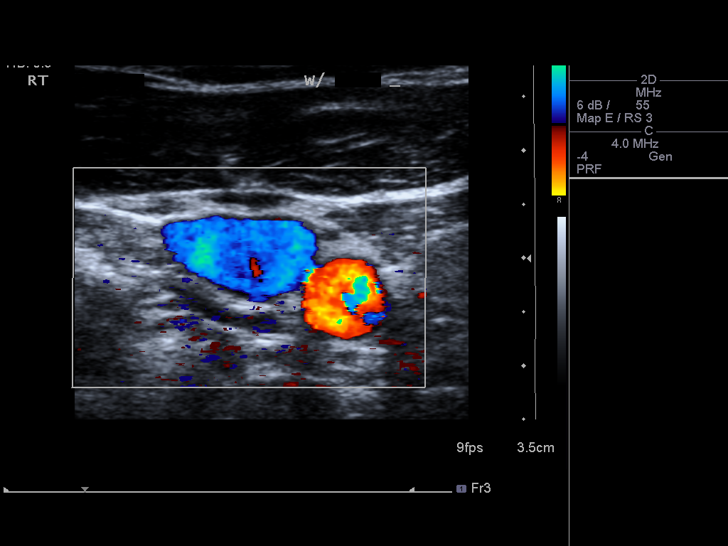
[im 5/10]
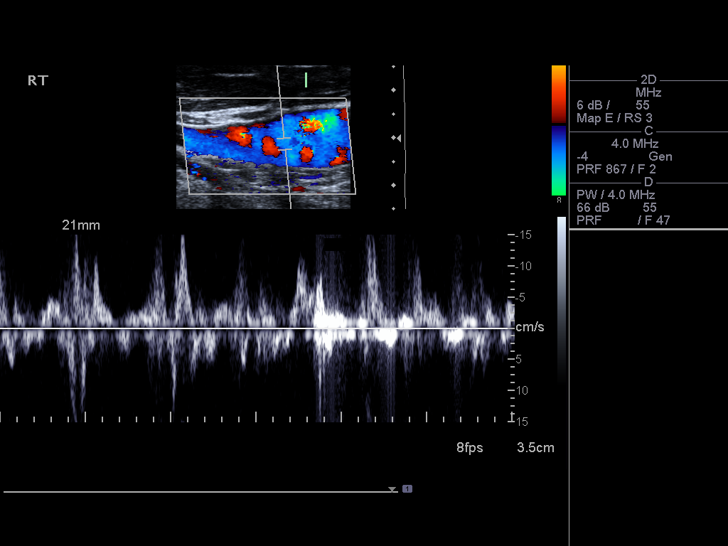
[im 6/10]
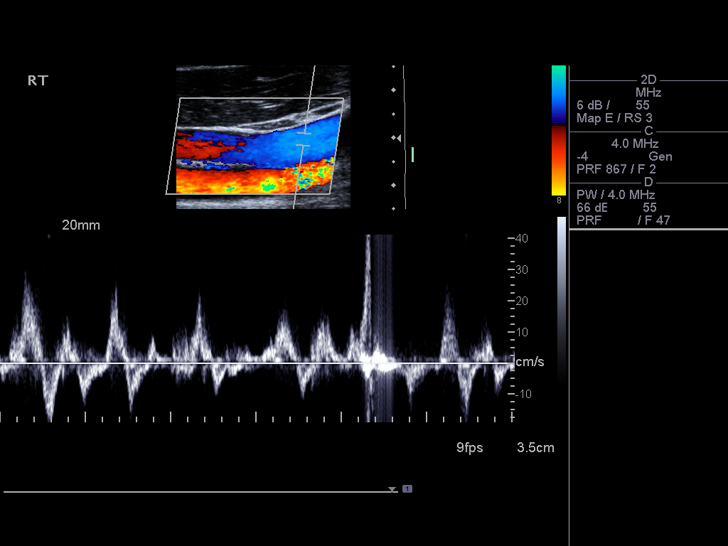
[im 7/10]
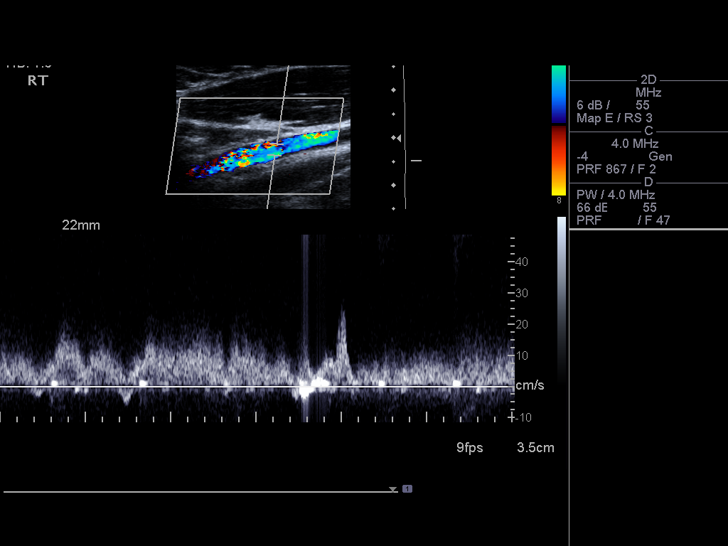
[im 8/10]
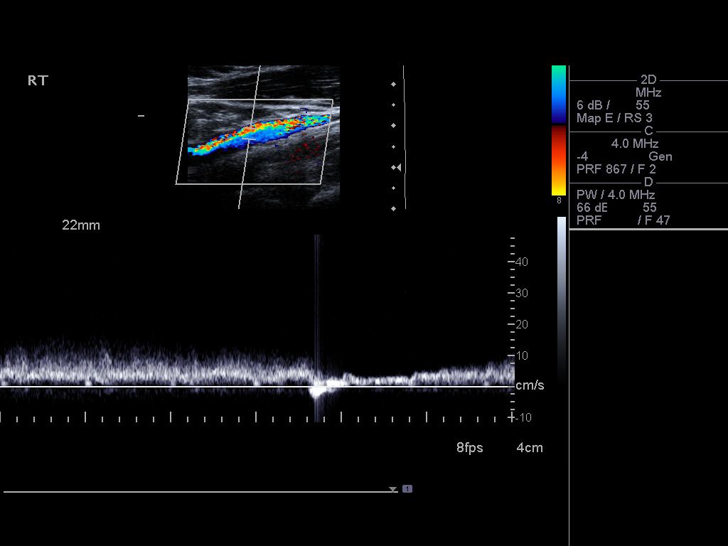
[im 9/10]
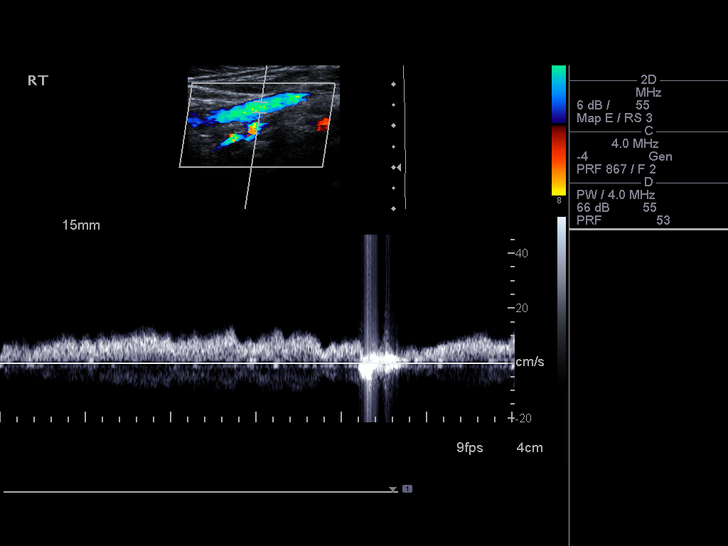
[im 10/10]
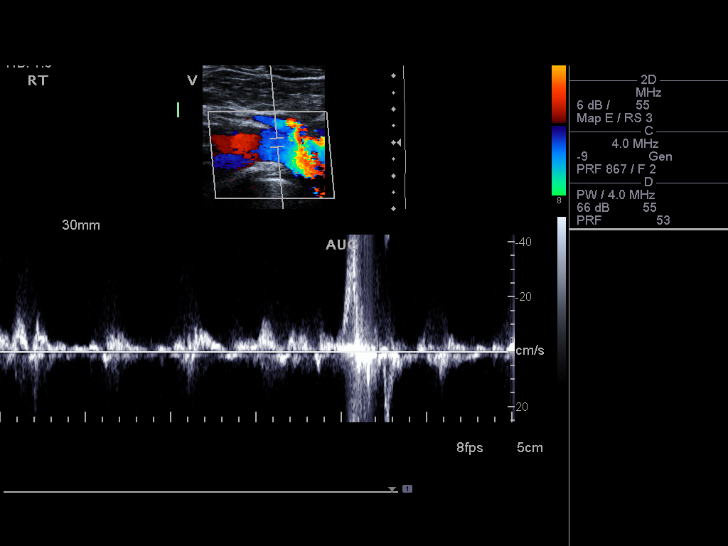

[10 of 10 positions shown; findings below may reference images not displayed]

FINDINGS: There is normal flow, compressibility, phasicity and
augmentation in the right jugular vein.
IMPRESSION: No evidence of right jugular vein thrombosis.

## 2020-09-28 ENCOUNTER — Encounter: Payer: Self-pay | Admitting: Family Medicine
